# Patient Record
Sex: Female | Born: 2000 | Race: Black or African American | Hispanic: No | Marital: Single | Smoking: Current some day smoker
Health system: Southern US, Community
[De-identification: ages and names within clinical notes are randomized; demographics above are authoritative.]

## PROBLEM LIST (undated history)

## (undated) ENCOUNTER — Emergency Department (HOSPITAL_COMMUNITY): Admission: EM | Payer: No Typology Code available for payment source | Source: Home / Self Care

## (undated) DIAGNOSIS — Z789 Other specified health status: Secondary | ICD-10-CM

## (undated) HISTORY — PX: OTHER SURGICAL HISTORY: SHX169

## (undated) HISTORY — DX: Other specified health status: Z78.9

---
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
## 2022-03-09 ENCOUNTER — Ambulatory Visit: Payer: Self-pay

## 2022-03-16 ENCOUNTER — Ambulatory Visit: Payer: Self-pay

## 2022-04-19 ENCOUNTER — Ambulatory Visit: Payer: Self-pay

## 2022-05-09 ENCOUNTER — Encounter: Payer: Self-pay | Admitting: Family Medicine

## 2022-05-09 ENCOUNTER — Ambulatory Visit: Payer: Medicaid Other | Admitting: Family Medicine

## 2022-05-09 VITALS — Temp 97.7°F

## 2022-05-09 DIAGNOSIS — Z113 Encounter for screening for infections with a predominantly sexual mode of transmission: Secondary | ICD-10-CM

## 2022-05-09 DIAGNOSIS — N730 Acute parametritis and pelvic cellulitis: Secondary | ICD-10-CM

## 2022-05-09 DIAGNOSIS — B3731 Acute candidiasis of vulva and vagina: Secondary | ICD-10-CM

## 2022-05-09 LAB — WET PREP FOR TRICH, YEAST, CLUE: Trichomonas Exam: NEGATIVE

## 2022-05-09 MED ORDER — CEFTRIAXONE SODIUM 500 MG IJ SOLR
500.0000 mg | Freq: Once | INTRAMUSCULAR | Status: AC
  Administered 2022-05-09: 500 mg via INTRAMUSCULAR

## 2022-05-09 MED ORDER — DOXYCYCLINE HYCLATE 100 MG PO TABS: 100.0000 mg | ORAL_TABLET | Freq: Two times a day (BID) | ORAL | 0 refills | Status: AC

## 2022-05-09 MED ORDER — METRONIDAZOLE 500 MG PO TABS: 500.0000 mg | ORAL_TABLET | Freq: Two times a day (BID) | ORAL | 0 refills | Status: DC

## 2022-05-09 MED ORDER — METRONIDAZOLE 500 MG PO TABS: 500.0000 mg | ORAL_TABLET | Freq: Two times a day (BID) | ORAL | 0 refills | Status: AC

## 2022-05-09 MED ORDER — CLOTRIMAZOLE 1 % VA CREA: 1.0000 | TOPICAL_CREAM | Freq: Every day | VAGINAL | 0 refills | Status: AC

## 2022-05-09 MED ORDER — CLOTRIMAZOLE 1 % EX CREA: TOPICAL_CREAM | Freq: Two times a day (BID) | CUTANEOUS | Status: DC

## 2022-05-09 NOTE — Progress Notes (Signed)
[facility] Department  STI clinic/screening visit [place] [address] [phone]  Subjective:  Lisa Garcia is a 21 y.o. female being seen today for an STI screening visit. The patient reports they do have symptoms.  Patient reports that they do not desire a pregnancy in the next year.   They reported they are not interested in discussing contraception today.    Patient's last menstrual period was 04/23/2022 (exact date).   Patient has the following medical conditions:  There are no problems to display for this patient.   Chief Complaint  Patient presents with  . SEXUALLY TRANSMITTED DISEASE    Screening patient stated she is having vaginal odor and irritation.  patient stated she has been having stomach pains for a week     HPI  Patient reports she is here for an STD testing. Client has  intermittent mild mid/lower-abdominal pain x 1 week and thick, white discharge with fishy odor x 1 week.  Denies fever or N/V.  States this is a new partner x 2 months.  Last HIV test per patient/review of record was 04/07/2019 Patient reports last pap- < 98 yo.   Screening for MPX risk: Does the patient have an unexplained rash? No Is the patient MSM? No Does the patient endorse multiple sex partners or anonymous sex partners? No Did the patient have close or sexual contact with a person diagnosed with MPX? No Has the patient traveled outside the [place] where MPX is endemic? No Is there a high clinical suspicion for MPX-- evidenced by one of the following No  -Unlikely to be chickenpox  -Lymphadenopathy  -Rash that present in same phase of evolution on any given body part See flowsheet for further details and programmatic requirements.   Immunization history:  Immunization History  Administered Date(s) Administered  . HPV 9-valent 09/30/2015, 04/18/2016  . Hepatitis A 06/09/2009, 07/14/2010  . Hepatitis B 03/09/03, 07/20/2003, 12/01/2003  . MMR  02/16/2005, 06/09/2009  . Meningococcal Conjugate 07/24/2014  . Pneumococcal Conjugate-13 07/20/2003, 09/24/2003, 12/01/2003, 06/30/2004  . Tdap 07/24/2014  . Varicella 06/30/2004, 06/09/2009     The following portions of the patient's history were reviewed and updated as appropriate: allergies, current medications, past medical history, past social history, past surgical history and problem list.  Objective:   Vitals:   05/09/22 1128  Temp: 97.7 F (36.5 C)  TempSrc: Oral    Physical Exam Constitutional:      Appearance: Normal appearance.  HENT:     Head: Normocephalic and atraumatic.  Pulmonary:     Effort: Pulmonary effort is normal.  Abdominal:     Palpations: Abdomen is soft.     Hernia: There is no hernia in the left inguinal area or right inguinal area.  Genitourinary:    General: Normal vulva.     Pubic Area: No rash or pubic lice.      Labia:        Right: No rash, tenderness, lesion or injury.        Left: No rash, tenderness, lesion or injury.      Urethra: No urethral swelling or urethral lesion.     Vagina: No foreign body. Vaginal discharge present. No erythema, tenderness, bleeding or lesions.     Cervix: Cervical motion tenderness and discharge present. No friability, lesion or cervical bleeding.     Uterus: Tender.      Adnexa:        Right: Tenderness present. No mass or  fullness.         Left: Tenderness present. No mass or fullness.       Rectum: Normal.     Comments: Bimanual- +2 CMT and adnexal tenderness/no masses  Musculoskeletal:        General: Normal range of motion.  Lymphadenopathy:     Lower Body: No right inguinal adenopathy. No left inguinal adenopathy.  Skin:    General: Skin is warm and dry.  Neurological:     General: No focal deficit present.     Mental Status: She is alert.  Psychiatric:        Mood and Affect: Mood normal.        Behavior: Behavior normal.     Assessment and Plan:  [name] is a 21 y.o. female  presenting to the [facility] Department for STI screening  1. Screening examination for venereal disease  - WET PREP FOR [place], YEAST, [facility]  2. PID (acute pelvic inflammatory disease)  - cefTRIAXone (ROCEPHIN) injection 500 mg - doxycycline (VIBRA-TABS) 100 MG tablet; Take 1 tablet (100 mg total) by mouth 2 (two) times daily for 14 days.  Dispense: 28 tablet; Refill: 0 - metroNIDAZOLE (FLAGYL) 500 MG tablet; Take 1 tablet (500 mg total) by mouth 2 (two) times daily for 14 days.  Dispense: 28 tablet; Refill: 0 Co. To abstain from sex x 2 weeks Co that partner needs treatment and no sexual activity until both have completed treatment. PID f/u 2-3 days and co to notify clinic if symptoms not resolving.   3. Yeast vaginitis  - clotrimazole (LOTRIMIN) 1 % cream     Return PIOD F/U in 2-3 days or sooner if symptoms worsen or fail to improve.  No future appointments.  Hassell Done, FNP

## 2022-05-09 NOTE — Progress Notes (Addendum)
Pt here for STI screening.  Wet mount results reviewed.  Few yeast noted.  Per provider orders, pt to be treated for PID and yeast.  Doxycycline 100mg . #28, Metronidazole 500mg  #28, and Clotrimazole 1% vaginal cream 1 box dispensed.  Ceftriaxone 500mg  IM given without complications.  Pt counseled on medications, side effects, plan of care and when to contact clinic for questions or concerns.  Pt verbalizes understanding of above.  Condoms provided.-Collins Scotland, RN
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
## 2023-03-08 ENCOUNTER — Encounter: Payer: Self-pay | Admitting: Physician Assistant

## 2023-03-08 ENCOUNTER — Ambulatory Visit: Payer: Commercial Managed Care - PPO | Admitting: Physician Assistant

## 2023-03-08 DIAGNOSIS — Z1331 Encounter for screening for depression: Secondary | ICD-10-CM

## 2023-03-08 DIAGNOSIS — A599 Trichomoniasis, unspecified: Secondary | ICD-10-CM

## 2023-03-08 DIAGNOSIS — Z113 Encounter for screening for infections with a predominantly sexual mode of transmission: Secondary | ICD-10-CM

## 2023-03-08 DIAGNOSIS — Z3169 Encounter for other general counseling and advice on procreation: Secondary | ICD-10-CM

## 2023-03-08 LAB — WET PREP FOR TRICH, YEAST, CLUE
Trichomonas Exam: POSITIVE — AB
Yeast Exam: NEGATIVE

## 2023-03-08 MED ORDER — METRONIDAZOLE 500 MG PO TABS: 500.0000 mg | ORAL_TABLET | Freq: Two times a day (BID) | ORAL | 0 refills | Status: AC

## 2023-03-08 NOTE — Progress Notes (Signed)
[facility] Department  STI clinic/screening visit [address] [place] [address] [phone]  Subjective:  Lisa Garcia is a 22 y.o. female being seen today for an STI screening visit. The patient reports they do have symptoms.  Patient reports that they do desire a pregnancy in the next year.   They reported they are not interested in discussing contraception today.    Patient's last menstrual period was 02/26/2023 (exact date).  Patient has the following medical conditions:  There are no problems to display for this patient.   Chief Complaint  Patient presents with   SEXUALLY TRANSMITTED DISEASE    Young woman here for STI visit. C/o intermittent vaginal odor.    Patient reports intermittent vaginal odor, last several days prior to LMP of 02/26/23.  Does the patient using douching products? No  Last HIV test per patient/review of record was 04/09/19 HIV = nonreactive.   Patient reports last pap was No results found for: "DIAGPAP" No results found for: "SPECADGYN"  Screening for MPX risk: Does the patient have an unexplained rash? No Is the patient MSM? No Does the patient endorse multiple sex partners or anonymous sex partners? No Did the patient have close or sexual contact with a person diagnosed with MPX? No Has the patient traveled outside the [place] where MPX is endemic? No Is there a high clinical suspicion for MPX-- evidenced by one of the following No  -Unlikely to be chickenpox  -Lymphadenopathy  -Rash that present in same phase of evolution on any given body part See flowsheet for further details and programmatic requirements.   Immunization history:  Immunization History  Administered Date(s) Administered   HPV 9-valent 09/30/2015, 04/18/2016   Hepatitis A 06/09/2009, 07/14/2010   Hepatitis B 04/05/2003, 07/20/2003, 12/01/2003   MMR 02/16/2005, 06/09/2009   Meningococcal Conjugate 07/24/2014   Pneumococcal Conjugate-13  07/20/2003, 09/24/2003, 12/01/2003, 06/30/2004   Tdap 07/24/2014   Varicella 06/30/2004, 06/09/2009     The following portions of the patient's history were reviewed and updated as appropriate: allergies, current medications, past medical history, past social history, past surgical history and problem list.  Objective:  There were no vitals filed for this visit.  Physical Exam Vitals and nursing note reviewed.  Constitutional:      Appearance: Normal appearance.  HENT:     Head: Normocephalic and atraumatic.     Mouth/Throat:     Mouth: Mucous membranes are moist.     Pharynx: Oropharynx is clear. No oropharyngeal exudate or posterior oropharyngeal erythema.  Pulmonary:     Effort: Pulmonary effort is normal.  Abdominal:     General: Abdomen is flat.     Palpations: There is no mass.     Tenderness: There is no abdominal tenderness. There is no rebound.  Genitourinary:    General: Normal vulva.     Exam position: Lithotomy position.     Pubic Area: No rash or pubic lice.      Labia:        Right: No rash or lesion.        Left: No rash or lesion.      Vagina: Vaginal discharge present. No erythema, bleeding or lesions.     Cervix: No cervical motion tenderness, discharge, friability, lesion or erythema.     Uterus: Normal.      Adnexa: Right adnexa normal and left adnexa normal.     Rectum: Normal.     Comments: pH = 5.0 Mod amt thick  greenish-white vaginal discharge. Lymphadenopathy:     Head:     Right side of head: No preauricular or posterior auricular adenopathy.     Left side of head: No preauricular or posterior auricular adenopathy.     Cervical: No cervical adenopathy.     Upper Body:     Right upper body: No supraclavicular, axillary or epitrochlear adenopathy.     Left upper body: No supraclavicular, axillary or epitrochlear adenopathy.     Lower Body: No right inguinal adenopathy. No left inguinal adenopathy.  Skin:    General: Skin is warm and dry.      Findings: No rash.  Neurological:     Mental Status: She is alert and oriented to person, place, and time.      Assessment and Plan:  Lisa Garcia is a 22 y.o. female presenting to the [facility] Department for STI screening  1. Screening examination for venereal disease Treat wet prep per S.O. Pt - Chlamydia/Gonorrhea [facility] - WET PREP FOR TRICH, YEAST, CLUE  2. Positive depression screening Pt states she occ feels depressed, but no current depressive sx. No SI/HI. Accepts referral to LCSW for counseling/eval. Contact card given. - Ambulatory referral to Behavioral Health  3. Trichomoniasis Treat per S.O. - metroNIDAZOLE (FLAGYL) 500 MG tablet; Take 1 tablet (500 mg total) by mouth 2 (two) times daily for 7 days.  Dispense: 14 tablet; Refill: 0  4. Pre-conception counseling Enc to start MVI with folic acid daily. STI screening done today. Trich treated per S.O. Await other test results. Pt sched for family planning physical exam.   Patient accepted all screenings including, vaginal CT/GC, and wet prep. Declines bloodwork for HIV/syphilis Patient meets criteria for HepB screening? No. Ordered? no Patient meets criteria for HepC screening? No. Ordered? no  Treat wet prep per standing order Discussed time line for [facility] results and that patient will be called with positive results and encouraged patient to call if she had not heard in 2 weeks.  Counseled to return or seek care for continued or worsening symptoms Recommended repeat testing in 3 months with positive results. Recommended condom use with all sex for STI prevention.  Patient is currently using  nothing  to prevent pregnancy - desires pregnancy.  Return as scheduled for well woman exam.  Future Appointments  Date Time Provider Department Center  04/04/2023 11:00 AM [facility] PROVIDER [facility] None    Landry Dyke, PA-C

## 2023-03-08 NOTE — Progress Notes (Signed)
Wet mount reviewed and patient treated for trich per SO.Marland KitchenBurt Knack, RN

## 2023-03-08 NOTE — Progress Notes (Signed)
Patient here for STD testing. States she and her partner are planning pregnancy. Planning a healthy pregnancy booklet given today and patient has a PE appointment scheduled for 04/04/23.Marland KitchenBurt Knack, RN

## 2023-03-16 ENCOUNTER — Telehealth: Payer: Self-pay

## 2023-03-16 NOTE — Telephone Encounter (Signed)
Verified password, informed of positive results and need of treatment.  Appt scheduled for Monday 03/19/2023

## 2023-03-21 ENCOUNTER — Telehealth: Payer: Self-pay

## 2023-03-21 NOTE — Telephone Encounter (Signed)
LM for pt to return my call and reschedule her appointment

## 2023-03-23 ENCOUNTER — Telehealth: Payer: Self-pay

## 2023-03-23 ENCOUNTER — Ambulatory Visit: Payer: Commercial Managed Care - PPO

## 2023-03-23 DIAGNOSIS — A749 Chlamydial infection, unspecified: Secondary | ICD-10-CM

## 2023-03-23 MED ORDER — AZITHROMYCIN 500 MG PO TABS
1000.0000 mg | ORAL_TABLET | Freq: Once | ORAL | Status: AC
  Administered 2023-03-23: 1000 mg via ORAL

## 2023-03-23 NOTE — Telephone Encounter (Signed)
Called pt regarding her missed appointment on 03/19/2023.  Pt states that she will come today @ 2 pm.  Berdie Ogren, RN

## 2023-03-23 NOTE — Progress Notes (Signed)
Pt is here for treatment of Chlamydia.   The patient was dispensed Azithromycin 1000mg   today. I provided counseling today regarding the medication. We discussed the medication, the side effects and when to call clinic. Patient given the opportunity to ask questions. Questions answered.  Pt will call back if she vomits within the next 2 hours.   Condoms declined.  Berdie Ogren, RN

## 2023-04-04 ENCOUNTER — Ambulatory Visit: Payer: Commercial Managed Care - PPO

## 2023-06-15 ENCOUNTER — Other Ambulatory Visit: Payer: Commercial Managed Care - PPO

## 2023-10-04 ENCOUNTER — Other Ambulatory Visit: Payer: Self-pay

## 2023-10-04 ENCOUNTER — Encounter: Payer: Self-pay | Admitting: Emergency Medicine

## 2023-10-04 DIAGNOSIS — Z20822 Contact with and (suspected) exposure to covid-19: Secondary | ICD-10-CM | POA: Insufficient documentation

## 2023-10-04 DIAGNOSIS — R197 Diarrhea, unspecified: Secondary | ICD-10-CM | POA: Insufficient documentation

## 2023-10-04 DIAGNOSIS — R109 Unspecified abdominal pain: Secondary | ICD-10-CM | POA: Insufficient documentation

## 2023-10-04 DIAGNOSIS — R112 Nausea with vomiting, unspecified: Secondary | ICD-10-CM | POA: Insufficient documentation

## 2023-10-04 LAB — CBC
HCT: 43.2 % (ref 36.0–46.0)
Hemoglobin: 13.8 g/dL (ref 12.0–15.0)
MCH: 27.2 pg (ref 26.0–34.0)
MCHC: 31.9 g/dL (ref 30.0–36.0)
MCV: 85 fL (ref 80.0–100.0)
Platelets: 299 10*3/uL (ref 150–400)
RBC: 5.08 MIL/uL (ref 3.87–5.11)
RDW: 13 % (ref 11.5–15.5)
WBC: 10.3 10*3/uL (ref 4.0–10.5)
nRBC: 0 % (ref 0.0–0.2)

## 2023-10-04 LAB — COMPREHENSIVE METABOLIC PANEL
ALT: 17 U/L (ref 0–44)
AST: 19 U/L (ref 15–41)
Albumin: 4.3 g/dL (ref 3.5–5.0)
Alkaline Phosphatase: 53 U/L (ref 38–126)
Anion gap: 13 (ref 5–15)
BUN: 12 mg/dL (ref 6–20)
CO2: 20 mmol/L — ABNORMAL LOW (ref 22–32)
Calcium: 9.1 mg/dL (ref 8.9–10.3)
Chloride: 102 mmol/L (ref 98–111)
Creatinine, Ser: 0.63 mg/dL (ref 0.44–1.00)
GFR, Estimated: >60 mL/min (ref >60)
Glucose, Bld: 92 mg/dL (ref 70–99)
Potassium: 3.8 mmol/L (ref 3.5–5.1)
Sodium: 135 mmol/L (ref 135–145)
Total Bilirubin: 2.1 mg/dL — ABNORMAL HIGH (ref 0.0–1.2)
Total Protein: 7.7 g/dL (ref 6.5–8.1)

## 2023-10-04 LAB — URINALYSIS, ROUTINE W REFLEX MICROSCOPIC
Bilirubin Urine: NEGATIVE
Glucose, UA: NEGATIVE mg/dL
Hgb urine dipstick: NEGATIVE
Ketones, ur: 20 mg/dL — AB
Leukocytes,Ua: NEGATIVE
Nitrite: NEGATIVE
Protein, ur: NEGATIVE mg/dL
Specific Gravity, Urine: 1.027 (ref 1.005–1.030)
pH: 6 (ref 5.0–8.0)

## 2023-10-04 LAB — LIPASE, BLOOD: Lipase: 29 U/L (ref 11–51)

## 2023-10-04 LAB — RESP PANEL BY RT-PCR (RSV, FLU A&B, COVID)  RVPGX2
Influenza A by PCR: NEGATIVE
Influenza B by PCR: NEGATIVE
Resp Syncytial Virus by PCR: NEGATIVE
SARS Coronavirus 2 by RT PCR: NEGATIVE

## 2023-10-04 LAB — POC URINE PREG, ED: Preg Test, Ur: NEGATIVE

## 2023-10-04 MED ORDER — ONDANSETRON 4 MG PO TBDP
4.0000 mg | ORAL_TABLET | Freq: Once | ORAL | Status: AC | PRN
  Administered 2023-10-04: 4 mg via ORAL
  Filled 2023-10-04: qty 1

## 2023-10-04 NOTE — ED Triage Notes (Signed)
Patient ambulatory to triage with complaints of nausea and vomiting since 9am this morning, along with chills and slight headache. All symptoms started today.

## 2023-10-04 NOTE — ED Provider Triage Note (Signed)
Emergency Medicine Provider Triage Evaluation Note  Lisa Garcia , a 23 y.o. female  was evaluated in triage.  Pt complains of nausea and vomiting that began this morning around 9am. Also having chills and headache.   Review of Systems  Positive: Nausea, vomiting, headache,  Negative: Urinary symptoms, diarrhea  Physical Exam  BP (!) 147/93   Pulse 85   Temp 97.8 F (36.6 C) (Oral)   Resp 18   Ht 5\' 4"  (1.626 m)   Wt 70.3 kg   LMP 09/07/2023 (Approximate)   SpO2 100%   BMI 26.61 kg/m  Gen:   Awake, no distress   Resp:  Normal effort  MSK:   Moves extremities without difficulty  Other:    Medical Decision Making  Medically screening exam initiated at 7:24 PM.  Appropriate orders placed.  Lisa Garcia was informed that the remainder of the evaluation will be completed by another provider, this initial triage assessment does not replace that evaluation, and the importance of remaining in the ED until their evaluation is complete.    Cameron Ali, PA-C 10/04/23 1925

## 2023-10-05 ENCOUNTER — Emergency Department
Admission: EM | Admit: 2023-10-05 | Discharge: 2023-10-05 | Disposition: A | Discharge status: Home / Self Care | Payer: Self-pay | Attending: Emergency Medicine | Admitting: Emergency Medicine

## 2023-10-05 DIAGNOSIS — R112 Nausea with vomiting, unspecified: Secondary | ICD-10-CM

## 2023-10-05 MED ORDER — ONDANSETRON 4 MG PO TBDP: 4.0000 mg | ORAL_TABLET | Freq: Three times a day (TID) | ORAL | 0 refills | Status: AC | PRN

## 2023-10-05 NOTE — ED Provider Notes (Signed)
[facility] Provider Note    Event Date/Time   First MD Initiated Contact with Patient 10/05/23 [phone]     (approximate)   History   Emesis   HPI  Lisa Garcia is a 23 y.o. female   Past medical history of healthy young woman who presents to Emergency Department with a couple of days of nausea vomiting diarrhea.  No GI bleeding.  Crampy abdominal pain.  No known sick contacts.  No urinary symptoms.       Physical Exam   Triage Vital Signs: ED Triage Vitals [10/04/23 1921]  Encounter Vitals Group     BP (!) 147/93     Systolic BP Percentile      Diastolic BP Percentile      Pulse Rate 85     Resp 18     Temp 97.8 F (36.6 C)     Temp Source Oral     SpO2 100 %     Weight 155 lb (70.3 kg)     Height 5\' 4"  (1.626 m)     Head Circumference      Peak Flow      Pain Score 0     Pain Loc      Pain Education      Exclude from Growth Chart     Most recent vital signs: Vitals:   10/04/23 1921  BP: (!) 147/93  Pulse: 85  Resp: 18  Temp: 97.8 F (36.6 C)  SpO2: 100%    General: Awake, no distress.  CV:  Good peripheral perfusion.  Resp:  Normal effort.  Abd:  No distention.  Other:  Awake alert comfortable euvolemic appearing slightly hypertensive otherwise vital signs normal.  She has a soft benign abdominal exam deep palpation all quadrants without rigidity or guarding.   ED Results / Procedures / Treatments   Labs (all labs ordered are listed, but only abnormal results are displayed) Labs Reviewed  COMPREHENSIVE METABOLIC PANEL - Abnormal; Notable for the following components:      Result Value   CO2 20 (*)    Total Bilirubin 2.1 (*)    All other components within normal limits  URINALYSIS, ROUTINE W REFLEX MICROSCOPIC - Abnormal; Notable for the following components:   Color, Urine YELLOW (*)    APPearance HAZY (*)    Ketones, ur 20 (*)    All other components within normal limits  RESP PANEL BY RT-PCR (RSV, FLU A&B,  COVID)  RVPGX2  LIPASE, BLOOD  CBC  POC URINE PREG, ED     I ordered and reviewed the above labs they are notable for cell counts and electrolytes unremarkable.  pregnancy is negative.    PROCEDURES:  Critical Care performed: No  Procedures   MEDICATIONS ORDERED IN ED: Medications  ondansetron (ZOFRAN-ODT) disintegrating tablet 4 mg (4 mg Oral Given 10/04/23 1929)     IMPRESSION / MDM / ASSESSMENT AND PLAN / ED COURSE  I reviewed the triage vital signs and the nursing notes.                                Patient's presentation is most consistent with acute presentation with potential threat to life or bodily function.  Differential diagnosis includes, but is not limited to, viral gastroenteritis, colitis, appendicitis, cholecystitis, diverticulitis, obstruction, pregnancy related complications (   The patient is on the cardiac monitor to evaluate for evidence of arrhythmia  and/or significant heart rate changes.  MDM:    Crampy abdominal pain in the setting of nausea vomiting diarrhea with a benign abdominal exam, normal labs, normal urinalysis and overall well-appearing nontoxic euvolemic patient.  Most likely viral gastroenteritis and unlikely surgical abdominal pathologies at this time in the setting of benign abdominal exam.  Anticipatory guidance given, she will follow-up with PMD referral was given.  Zofran prescription.  Return precautions.       FINAL CLINICAL IMPRESSION(S) / ED DIAGNOSES   Final diagnoses:  Nausea vomiting and diarrhea     Rx / DC Orders   ED Discharge Orders          Ordered    ondansetron (ZOFRAN-ODT) 4 MG disintegrating tablet  Every 8 hours PRN        10/05/23 0128    Ambulatory Referral to Primary Care (Establish Care)        10/05/23 0130             Note:  This document was prepared using Dragon voice recognition software and may include unintentional dictation errors.    Pilar Jarvis, MD 10/05/23 [phone]

## 2023-10-05 NOTE — ED Notes (Signed)
Patient discharged by provider without repeat VS. This RN visualized patient exit ER with steady even gait, NAD noted.

## 2023-10-05 NOTE — Discharge Instructions (Addendum)
Drink plenty of fluids to stay well-hydrated.  Find Pedialyte or similar electrolyte rehydration formulas at your local pharmacy. Take zofran as needed for nausea/vomiting.   Thank you for choosing [place] for your health care today!  Please see your primary doctor this week for a follow up appointment.   If you have any new, worsening, or unexpected symptoms call your doctor right away or come back to the emergency department for reevaluation.  It was my pleasure to care for you today.   Daneil Dan Modesto Charon, MD

## 2023-11-01 ENCOUNTER — Ambulatory Visit: Payer: Commercial Managed Care - PPO

## 2024-02-05 ENCOUNTER — Ambulatory Visit

## 2024-04-04 ENCOUNTER — Ambulatory Visit

## 2024-04-17 ENCOUNTER — Encounter: Payer: Self-pay | Admitting: Family Medicine

## 2024-04-17 ENCOUNTER — Ambulatory Visit: Admitting: Family Medicine

## 2024-04-17 VITALS — BP 123/67 | HR 63 | Ht 64.0 in | Wt 169.6 lb

## 2024-04-17 DIAGNOSIS — B3731 Acute candidiasis of vulva and vagina: Secondary | ICD-10-CM | POA: Insufficient documentation

## 2024-04-17 DIAGNOSIS — Z113 Encounter for screening for infections with a predominantly sexual mode of transmission: Secondary | ICD-10-CM

## 2024-04-17 DIAGNOSIS — Z309 Encounter for contraceptive management, unspecified: Secondary | ICD-10-CM

## 2024-04-17 DIAGNOSIS — A599 Trichomoniasis, unspecified: Secondary | ICD-10-CM

## 2024-04-17 DIAGNOSIS — N898 Other specified noninflammatory disorders of vagina: Secondary | ICD-10-CM

## 2024-04-17 DIAGNOSIS — K068 Other specified disorders of gingiva and edentulous alveolar ridge: Secondary | ICD-10-CM | POA: Insufficient documentation

## 2024-04-17 LAB — WET PREP FOR TRICH, YEAST, CLUE
Clue Cell Exam: NEGATIVE
Trichomonas Exam: POSITIVE — AB

## 2024-04-17 MED ORDER — CLOTRIMAZOLE 1 % VA CREA
1.0000 | TOPICAL_CREAM | Freq: Every day | VAGINAL | Status: AC
Start: 2024-04-17 — End: 2024-04-24

## 2024-04-17 MED ORDER — METRONIDAZOLE 500 MG PO TABS: 500.0000 mg | ORAL_TABLET | Freq: Two times a day (BID) | ORAL | Status: AC

## 2024-04-17 NOTE — Progress Notes (Addendum)
 Patient is here for family planning visit. Family planning education card given to patient. Wet prep results reviewed; patient dispensed Metronidazole  500mg  #14 BID x 7 days per order by JAYSON Helling, MD. Patient also dispensed Clotrimazole  1% vaginal cream x 7 days per order by JAYSON Helling, MD. I provided counseling today regarding the medication. We discussed the medication, the side effects and when to call clinic. All questions answered and verbalizes understanding.   Doyce CINDERELLA Shuck, RN

## 2024-04-17 NOTE — Assessment & Plan Note (Signed)
 Acute, improving. Excessive gum growth over bilateral lower molars. Previously painful, now pain improved. Desires dental referral. No dental insurance. Will refer to [facility] via paper form.

## 2024-04-17 NOTE — Progress Notes (Signed)
 Referral faxed to [facility] health dental. Larraine JONELLE Northern, RN

## 2024-04-17 NOTE — Patient Instructions (Signed)
 STI screening - Today we obtained a vaginal swab to screen for gonorrhea, chlamydia, and trichomonas and oral swab to screen for gonorrhea - If the results are abnormal, I will give you a call.    Estimated time frame for results collected at the [facility] Department: Same day Trichomonas Yeast BV (bacterial vaginosis)  Within 1-2 weeks Gonorrhea Chlamydia  Within 1-2 weeks HIV Syphilis Hepatitis B Hepatitis C

## 2024-04-17 NOTE — Assessment & Plan Note (Signed)
 Treating presumptively due to history and physical exam. Clotrimazole  cream at bedtime x 7 days to entire affected area. Cannot be certain she is not pregnant, therefore will not use fluconazole.

## 2024-04-17 NOTE — Addendum Note (Signed)
 Addended by: ROLAN DOYCE GRADE on: 04/17/2024 10:39 AM   Modules accepted: Orders

## 2024-04-17 NOTE — Progress Notes (Signed)
 [employer] HE[facility] DEPARTMENT [facility] [address] [place] [address] Main phone: [phone]  Family Planning Visit - Repeat Yearly Visit  Subjective:  Lisa Garcia is a 23 y.o. G0P0000  being seen today for an annual wellness visit and to discuss contraception options. The patient is currently using female condom for pregnancy prevention. Patient does not want a pregnancy in the next year.   Patient has the following medical problems:  Patient Active Problem List   Diagnosis Date Noted  . Vulvovaginitis due to yeast 04/17/2024  . Gum lesion 04/17/2024   Chief Complaint  Patient presents with  . Annual Exam    PE, STI testing   HPI Patient reports she would like physical exam, STI testing, and evaluation of vaginal discharge. Reports 2-3 days of discharge, odor, redness, and irritation. Denies rashes, lesions, lymphadenopathy, fever, chills, dysuria.  Also has a concern about growths on her gums.   Review of Systems  Constitutional:  Negative for fever, malaise/fatigue and weight loss.  Respiratory:  Negative for shortness of breath.   Cardiovascular:  Negative for chest pain and palpitations.  (+) sternal/ epigastric pain, intermittently without pattern 2x month, located medially, resolves spontaneously (+) vaginal discharge and odor  See flowsheet for further details and programmatic requirements Hyperlink available at the top of the signed note in blue.  Flow sheet content below:  Pregnancy Intention Screening Does the patient want to become pregnant in the next year?: No Does the patient's partner want to become pregnant in the next year?: No Would the patient like to discuss contraceptive options today?: No Other:  Password: [id number] Contraception History Past methods of contraception used by patient:: Female Condom Adverse effects associated with Female Condom: none Sexual History What age did you start your period?: 16 How  often do you have your period?: monthly Date of last sex?: 04/07/24 Has the patient had unprotected sex within the last 5 days?: No Do you have sex with men, women, both men and women?: Men only In the past 2 months how many partners have you had sex with?: 1 In the past 12 months, how many partners have you had sex with?: 2 Is it possible that any of your sex partners in the past 12 months had sex with someone else whild they were still in a sexual relationship with you?: Yes What ways do you have sex?: Vaginal, Oral Do you or your partner use condoms and/or dental dams every time you have vaginal, oral or anal sex?: No Do you douche?: No Date of last HIV test?: 04/08/19 Have you ever had an STD?: Yes Have any of your partners had an STD?: Yes Partner Previous STD?: Chlamydia Date?: 03/23/23 Have you or your partner ever shot up drugs?: No Have any of your partners used drugs in the past?: No Have you or your partners exchanged money or drugs for sex?: No Risk Factors for Hep B Household, sexual, or needle sharing contact of a person infected with Hep B: No Sexual contact with a person who uses drugs not as prescribed?: No Currently or Ever used drugs not as prescribed: No HIV Positive: No PRep Patient: No Men who have sex with men: N/A Have Hepatitis C: No History of Incarceration: Yes History of Homeslessness?: No Anal sex following anal drug use?: N/A Risk Factors for Hep C Currently using drugs not as prescribed: No Sexual partner(s) currently using drugs as not prescribed: No History of drug use: No HIV  Positive: No People with a history of incarceration: Yes People born between the years of 68 and 67: No Hepatitis Counseling Hep B Counseling: Counseled patient about increased risk of Hep B and recommendation for testing, Patient declines testing for Hep B today Hep C Counseling: Counseled patient about increased risk of Hep C and recommendation for testing, Patient  declines testing for Hep C today Counseling Education: Make informed decision about family planning, Reduce risk of transmission and protection from STD's and HIV, Understand BMI >25 or >18.5 is a health risk (weight management educational materials to be provided to client requests), Promoted daily consumption of MVI with folic acid if capable of conceiving., Review immunization history, inform client of recommended vaccines per CDC's ACIP Guidelines and refer to Immunization clinic, Results of physical assessment and labs (if performed), Warning signs for rare but serious adverse events and what to do if they experience a warning sign (including emergency 24 hour number, where to seek emergency service outside of hours of operation), When to return for follow up (planned return schedule) Contraception Wrap Up Current Method: Female Condom End Method: Female Condom Contraception Counseling Provided: Yes How was the end contraceptive method provided?: Provided on site  Diabetes screening This patient is 23 y.o. with a BMI of Body mass index is 29.11 kg/m.SABRA  Is patient eligible for diabetes screening (age >35 and BMI >25)?  no  Was Hgb A1c ordered? not applicable  STI screening Patient reports 1 of partners in last year.  Does this patient desire STI screening?  Yes  Hepatitis C screening Has patient been screened once for HCV in the past?  No  No results found for: HCVAB  Does the patient meet criteria for HCV testing? Yes   Hepatitis B screening Does the patient meet criteria for HBV testing? Yes  Cervical Cancer Screening  No Cervical Cancer Screening results to display.  Health Maintenance Due  Topic Date Due  . Pneumococcal Vaccine 47-54 Years old (1 of 1 - PPSV23, PCV20, or PCV21) 05/29/2009  . HIV Screening  Never done  . Meningococcal B Vaccine (1 of 2 - Standard) Never done  . Hepatitis C Screening  Never done  . CHLAMYDIA SCREENING  09/25/2022  . COVID-19 Vaccine (1 -  2024-25 season) Never done   The following portions of the patient's history were reviewed and updated as appropriate: allergies, current medications, past family history, past medical history, past social history, past surgical history and problem list. Problem list updated.  Objective:   Vitals:   04/17/24 0925  BP: 123/67  Pulse: 63  Weight: 169 lb 9.6 oz (76.9 kg)  Height: 5' 4 (1.626 m)    Physical Exam Vitals and nursing note reviewed. Exam conducted with a chaperone present Brett Orange CNA).  Constitutional:      Appearance: Normal appearance.  HENT:     Head: Normocephalic and atraumatic.     Mouth/Throat:     Mouth: Mucous membranes are moist.     Pharynx: Oropharynx is clear. No oropharyngeal exudate or posterior oropharyngeal erythema.     Comments: Excessive gum tissue growth over bilateral lower molars Eyes:     General: No scleral icterus.       Right eye: No discharge.        Left eye: No discharge.     Conjunctiva/sclera: Conjunctivae normal.  Pulmonary:     Effort: Pulmonary effort is normal.  Genitourinary:    General: Normal vulva.     Exam position:  Lithotomy position.     Pubic Area: No rash or pubic lice.      Tanner stage (genital): 5.     Labia:        Right: Tenderness present. No rash or lesion.        Left: Tenderness present. No rash or lesion.      Vagina: Vaginal discharge and erythema present.     Comments: pH = 7. Declines speculum exam, prefers blind swabs. Labia minora and majora erythematous and covered with adherent thick white discharge.  Musculoskeletal:     Cervical back: Neck supple. No rigidity or tenderness.  Lymphadenopathy:     Head:     Right side of head: No preauricular or posterior auricular adenopathy.     Left side of head: No preauricular or posterior auricular adenopathy.     Cervical: No cervical adenopathy.     Right cervical: No superficial or posterior cervical adenopathy.    Left cervical: No superficial or  posterior cervical adenopathy.     Upper Body:     Right upper body: No supraclavicular adenopathy.     Left upper body: No supraclavicular adenopathy.  Skin:    General: Skin is warm and dry.     Coloration: Skin is not jaundiced or pale.     Findings: No bruising, erythema, lesion or rash.  Neurological:     General: No focal deficit present.     Mental Status: She is alert and oriented to person, place, and time.  Psychiatric:        Mood and Affect: Mood normal.        Behavior: Behavior normal.    Assessment and Plan:  Shaindel N Syme is a 23 y.o. female G0P0000 presenting to the [facility] Department for an yearly wellness and contraception visit  Contraception counseling:  Reviewed options based on patient desire and reproductive life plan. Patient is interested in No Method - No Contraceptive Precautions. This was provided to the patient today.   Risks, benefits, and typical effectiveness rates were reviewed.  Questions were answered.  Written information was also given to the patient to review.    The patient will follow up in  1 years for surveillance.  The patient was told to call with any further questions, or with any concerns about this method of contraception.  Emphasized use of condoms 100% of the time for STI prevention.  Emergency Contraception Precautions (ECP): Patient assessed for need of ECP. She is not a candidate based on report of unprotected sex more than 120 hours ago (5 days).   Vaginal irritation  Vulvovaginitis due to yeast Assessment & Plan: Treating presumptively due to history and physical exam. Clotrimazole  cream at bedtime x 7 days to entire affected area. Cannot be certain she is not pregnant, therefore will not use fluconazole.   Orders: -     Clotrimazole ; Place 1 Applicatorful vaginally at bedtime for 7 days.  Screening examination for venereal disease -     Gonococcus culture -     WET PREP FOR TRICH, YEAST, CLUE -      Chlamydia/Gonorrhea [facility]  Gum lesion Assessment & Plan: Acute, improving. Excessive gum growth over bilateral lower molars. Previously painful, now pain improved. Desires dental referral. No dental insurance. Will refer to [facility] via paper form.     No follow-ups on file.  No future appointments.  Betsey CHRISTELLA Helling, MD

## 2024-04-22 LAB — GONOCOCCUS CULTURE

## 2024-09-22 ENCOUNTER — Ambulatory Visit

## 2024-09-22 DIAGNOSIS — Z111 Encounter for screening for respiratory tuberculosis: Secondary | ICD-10-CM

## 2024-09-25 ENCOUNTER — Ambulatory Visit (LOCAL_COMMUNITY_HEALTH_CENTER)

## 2024-09-25 DIAGNOSIS — Z111 Encounter for screening for respiratory tuberculosis: Secondary | ICD-10-CM

## 2024-09-25 LAB — TB SKIN TEST
Induration: 0 mm
TB Skin Test: POSITIVE
